# Patient Record
Sex: Male | Born: 2005 | Race: Black or African American | Hispanic: No | Marital: Single | Smoking: Never smoker
Health system: Southern US, Community
[De-identification: ages and names within clinical notes are randomized; demographics above are authoritative.]

---
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
## 2023-02-17 IMAGING — DX DG ANKLE COMPLETE 3+V*L*
3 series · 3 of 3 positions shown · non-contrast
Comparison: None.

CLINICAL DATA: Left lateral ankle pain and swelling, basketball
injury

EXAM:
LEFT ANKLE COMPLETE - 3+ VIEW

[ankle ap]
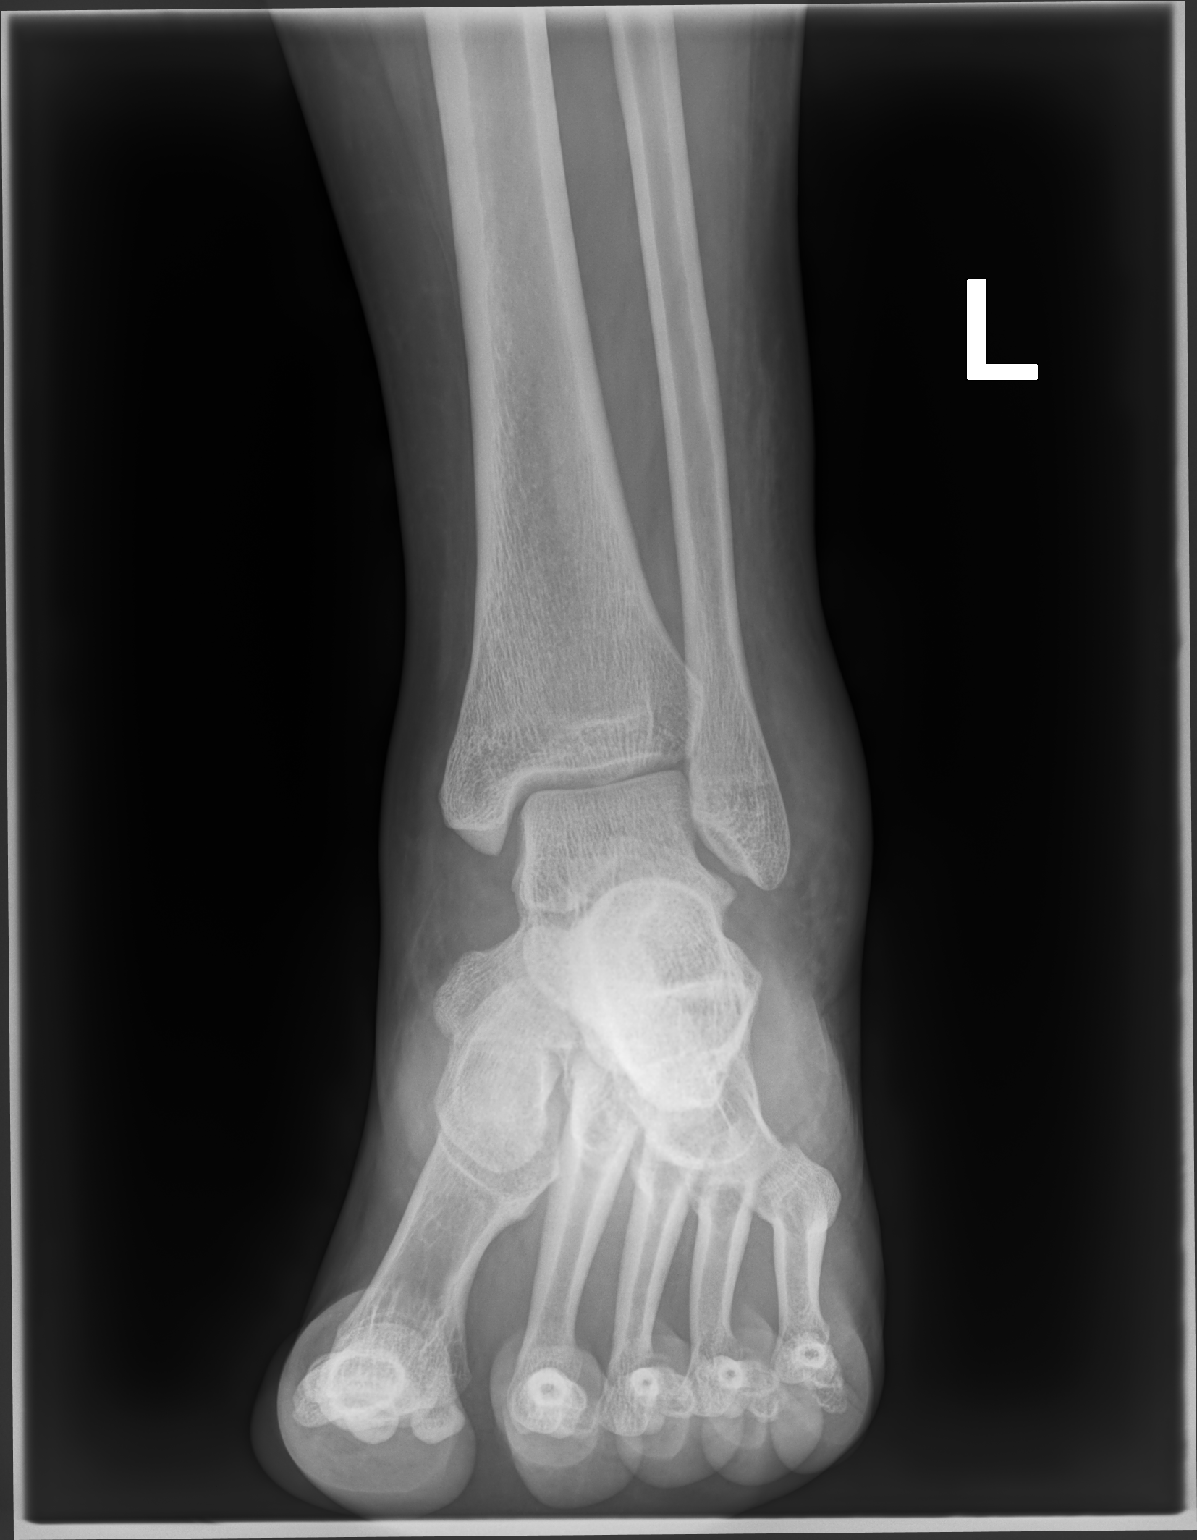

[ankle mlo]
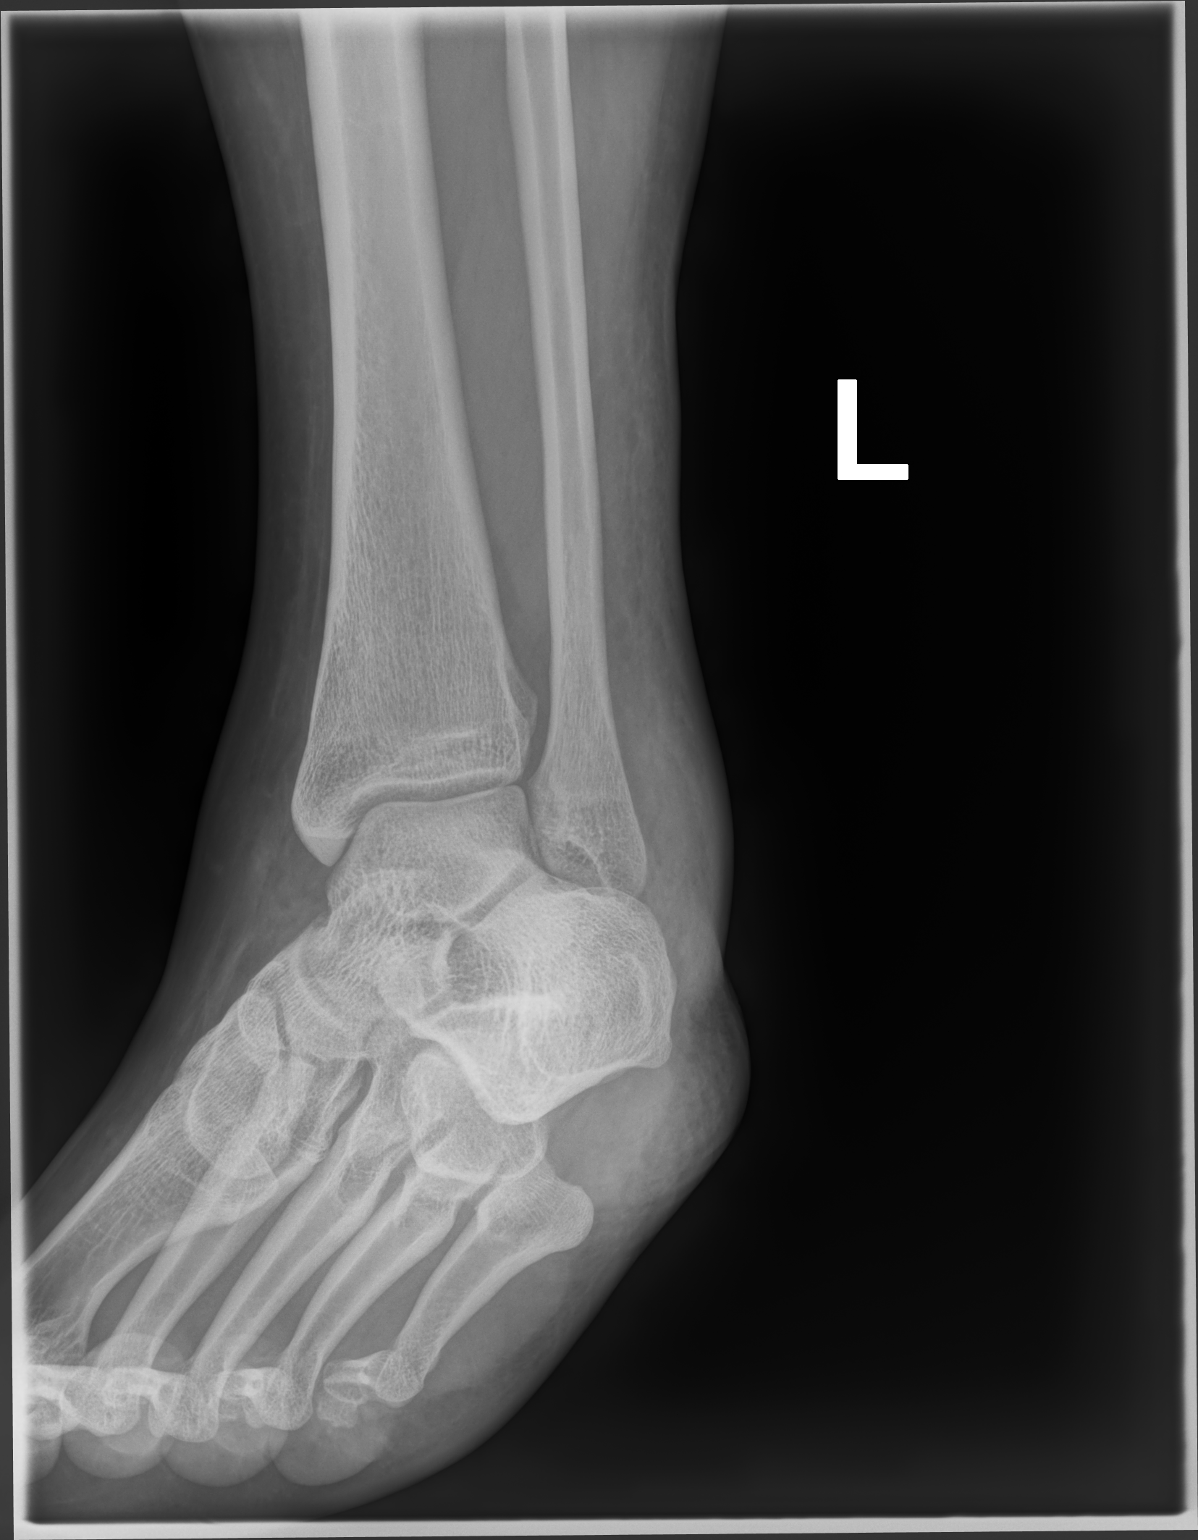

[ankle lat]
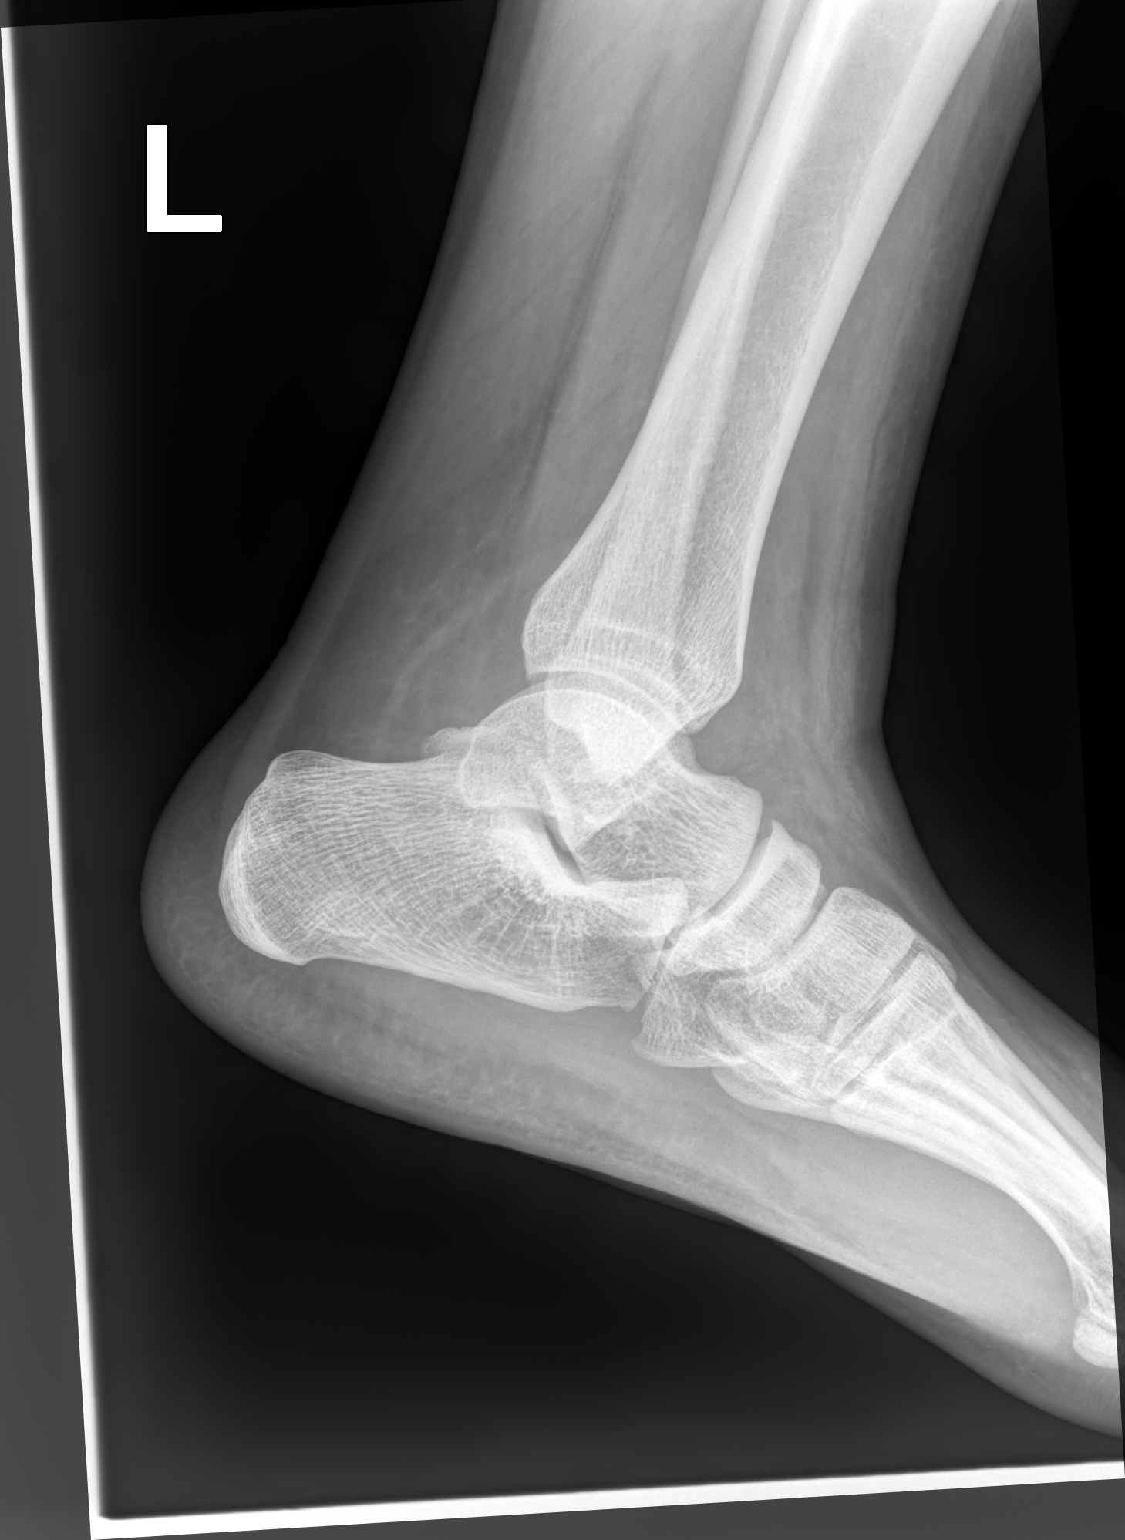

[3 of 3 positions shown; findings below may reference images not displayed]

FINDINGS: Frontal, oblique, and lateral views of the left ankle are obtained.
No acute fracture, subluxation, or dislocation. Joint spaces are
well preserved. There is diffuse soft tissue swelling, greatest
anteriorly and laterally.
IMPRESSION: 1. Marked soft tissue swelling.  No displaced fracture.
